# Patient Record
Sex: Male | Born: 1995 | Race: White | Hispanic: No | Marital: Single | Smoking: Never smoker
Health system: Southern US, Community
[De-identification: ages and names within clinical notes are randomized; demographics above are authoritative.]

---

## 2016-10-06 ENCOUNTER — Emergency Department (HOSPITAL_COMMUNITY): Admission: EM | Admit: 2016-10-06 | Discharge: 2016-10-06 | Discharge status: Against Medical Advice | Payer: Self-pay

## 2016-10-07 DIAGNOSIS — Z79899 Other long term (current) drug therapy: Secondary | ICD-10-CM | POA: Diagnosis not present

## 2016-10-07 DIAGNOSIS — G43809 Other migraine, not intractable, without status migrainosus: Secondary | ICD-10-CM | POA: Insufficient documentation

## 2016-10-07 DIAGNOSIS — H5712 Ocular pain, left eye: Secondary | ICD-10-CM | POA: Diagnosis present

## 2016-10-07 NOTE — ED Triage Notes (Signed)
Pt reports eye pain x 7 days.  Pt reports that he didn't injure his eye in any way.  Pt reports the pain feels like pressure behind his eye.  Pt reports that the corners of his vision are hazy.  Pt a/o x 4 and ambulatory. No obvious trauma noted to eye.

## 2016-10-08 ENCOUNTER — Other Ambulatory Visit: Payer: Self-pay | Admitting: Ophthalmology

## 2016-10-08 ENCOUNTER — Encounter (HOSPITAL_COMMUNITY): Payer: Self-pay | Admitting: *Deleted

## 2016-10-08 ENCOUNTER — Emergency Department (HOSPITAL_COMMUNITY)
Admission: EM | Admit: 2016-10-08 | Discharge: 2016-10-08 | Disposition: A | Discharge status: Home / Self Care | Payer: BLUE CROSS/BLUE SHIELD | Attending: Emergency Medicine | Admitting: Emergency Medicine

## 2016-10-08 DIAGNOSIS — G43109 Migraine with aura, not intractable, without status migrainosus: Secondary | ICD-10-CM

## 2016-10-08 DIAGNOSIS — G4459 Other complicated headache syndrome: Secondary | ICD-10-CM

## 2016-10-08 DIAGNOSIS — H5702 Anisocoria: Secondary | ICD-10-CM

## 2016-10-08 MED ORDER — SODIUM CHLORIDE 0.9 % IV BOLUS (SEPSIS)
1000.0000 mL | Freq: Once | INTRAVENOUS | Status: AC
  Administered 2016-10-08: 1000 mL via INTRAVENOUS

## 2016-10-08 MED ORDER — TETRACAINE HCL 0.5 % OP SOLN
1.0000 [drp] | Freq: Once | OPHTHALMIC | Status: AC
  Administered 2016-10-08: 1 [drp] via OPHTHALMIC
  Filled 2016-10-08: qty 4

## 2016-10-08 MED ORDER — KETOROLAC TROMETHAMINE 30 MG/ML IJ SOLN
30.0000 mg | Freq: Once | INTRAMUSCULAR | Status: AC
  Administered 2016-10-08: 30 mg via INTRAVENOUS
  Filled 2016-10-08: qty 1

## 2016-10-08 MED ORDER — BUTALBITAL-APAP-CAFFEINE 50-325-40 MG PO TABS: 1.0000 | ORAL_TABLET | Freq: Three times a day (TID) | ORAL | 0 refills | Status: AC | PRN

## 2016-10-08 MED ORDER — DEXAMETHASONE SODIUM PHOSPHATE 10 MG/ML IJ SOLN
10.0000 mg | Freq: Once | INTRAMUSCULAR | Status: AC
  Administered 2016-10-08: 10 mg via INTRAVENOUS
  Filled 2016-10-08: qty 1

## 2016-10-08 MED ORDER — FLUORESCEIN SODIUM 0.6 MG OP STRP
1.0000 | ORAL_STRIP | Freq: Once | OPHTHALMIC | Status: AC
  Administered 2016-10-08: 1 via OPHTHALMIC
  Filled 2016-10-08: qty 1

## 2016-10-08 NOTE — ED Provider Notes (Signed)
[facility]-EMERGENCY DEPT Provider Note   CSN: [id number] Arrival date & time: 10/07/16  2352  By signing my name below, I, Lyndon CodeMaurice Copeland Jr., attest that this documentation has been prepared under the direction and in the presence of TRW AutomotiveKelly Quantel Mcinturff, PA-C. Electronically Signed: Lyndon CodeMaurice Copeland Jr., ED Scribe. 10/08/16. 2:33 AM.   History   Chief Complaint Chief Complaint  Patient presents with  . Eye Pain    left    HPI  Matthew Zavala is a 21 y.o. male who presents to the Emergency Department complaining of constant, worsening mild to moderate OS eye pain with sudden onset x2 hours. Pt states that for the past x7 days he has had intermittent OS eye pain. He reportedly came to the ED yesterday for this problem but left before he could be seen. Today, pt presents with the OS eye pain that he states is worse and localized to the top portion of the eye. He describes the pain as a throbbing pressure and states that his vision is fuzzy at the top and bottom of the OS visual field. Pt reports photophobia. He has tried tylenol with no relief. He denies trauma to the eye. Of note, pt wears glasses as needed and denies wearing contacts.   The history is provided by the patient. No language interpreter was used.    History reviewed. No pertinent past medical history.  There are no active problems to display for this patient.   History reviewed. No pertinent surgical history.     Home Medications    Prior to Admission medications   Medication Sig Start Date End Date Taking? Authorizing Provider  butalbital-acetaminophen-caffeine (FIORICET, ESGIC) [phone] MG tablet Take 1-2 tablets by mouth every 8 (eight) hours as needed for headache. 10/08/16 10/08/17  Antony MaduraKelly Anjenette Gerbino, PA-C    Family History No family history on file.  Social History Social History  Substance Use Topics  . Smoking status: Never Smoker  . Smokeless tobacco: Never Used  . Alcohol use Yes     Comment: occasional      Allergies   Patient has no known allergies.   Review of Systems Review of Systems  Constitutional: Negative for fever.  Eyes: Positive for pain (OS).  Ten systems reviewed and are negative for acute change, except as noted in the HPI.    Physical Exam Updated Vital Signs BP 121/83   Pulse 102   Temp 97.7 F (36.5 C) (Oral)   Resp 16   Ht 6' (1.829 m)   Wt 63.5 kg   SpO2 95%   BMI 18.99 kg/m   Physical Exam  Constitutional: He is oriented to person, place, and time. He appears well-developed and well-nourished. No distress.  Nontoxic and in NAD  HENT:  Head: Normocephalic and atraumatic.  Mouth/Throat: Oropharynx is clear and moist.  Eyes: EOM are normal. Pupils are equal, round, and reactive to light. No scleral icterus.  Pain with EOMs of the left eye. No consensual photophobia. IOP is 11 with 95% CI and 15 with 95% CI on second attempt in the left eye. No proptosis or hyphema. Mild conjunctival injection. No tearing or purulence. No uptake on fluorescein staining of the left eye. No dendritic staining. Negative Seidel's sign. Negative cell and flare on slit lamp exam.  Neck: Normal range of motion.  Cardiovascular: Normal rate, regular rhythm and intact distal pulses.   Pulmonary/Chest: Effort normal and breath sounds normal. No respiratory distress. He has no wheezes.  Respirations even and unlabored  Abdominal:  Soft. There is no tenderness.  Musculoskeletal: He exhibits no edema.  Neurological: He is alert and oriented to person, place, and time. He exhibits normal muscle tone. Coordination normal.  GCS 15. Patient moving all extremities.  Skin: Skin is warm and dry. No rash noted. He is not diaphoretic. No erythema. No pallor.  Psychiatric: He has a normal mood and affect.  Nursing note and vitals reviewed.    ED Treatments / Results   DIAGNOSTIC STUDIES: Oxygen Saturation is 95% on RA, inadequate by my interpretation.   COORDINATION OF CARE: 12:02  AM-Discussed next steps with pt. Pt verbalized understanding and is agreeable with the plan.   2:10 AM-Patient with improvement in symptoms; reports mild, slight throbbing pain that persists. Will examine with slit lamp. Patient agrees.  Labs (all labs ordered are listed, but only abnormal results are displayed) Labs Reviewed - No data to display  EKG  EKG Interpretation None       Radiology No results found.  Procedures Procedures (including critical care time)  Medications Ordered in ED Medications  fluorescein ophthalmic strip 1 strip (1 strip Both Eyes Given by Other 10/08/16 0029)  tetracaine (PONTOCAINE) 0.5 % ophthalmic solution 1 drop (1 drop Both Eyes Given by Other 10/08/16 0029)  ketorolac (TORADOL) 30 MG/ML injection 30 mg (30 mg Intravenous Given 10/08/16 0040)  dexamethasone (DECADRON) injection 10 mg (10 mg Intravenous Given 10/08/16 0040)  sodium chloride 0.9 % bolus 1,000 mL (1,000 mLs Intravenous New Bag/Given 10/08/16 0040)     Initial Impression / Assessment and Plan / ED Course  I have reviewed the triage vital signs and the nursing notes.  Pertinent labs & imaging results that were available during my care of the patient were reviewed by me and considered in my medical decision making (see chart for details).     21 year old male presents to the emergency department for evaluation of 7 days of waxing and waning, gradually worsening pressure behind his left thigh causing pain. Patient with no evidence of acute glaucoma. No evidence of corneal abrasion, ulcer, or dendritic staining. No findings concerning for globe rupture. No consensual photophobia and negative cell and flare; doubt iritis/uveitis. Patient has had no acute vision loss. I do not have underlying concern for retinal detachment, optic neuritis, or arterial occlusion.   Symptoms have improved with Toradol and Decadron. Suspect ocular migraine. Will refer to ophthalmology for outpatient management.  Supportive therapy instructions provided. Will prescribe Fioricet for persistent headache. Return precautions discussed and provided. Patient discharged in stable condition with no unaddressed concerns.   Final Clinical Impressions(s) / ED Diagnoses   Final diagnoses:  Ocular migraine    New Prescriptions New Prescriptions   BUTALBITAL-ACETAMINOPHEN-CAFFEINE (FIORICET, ESGIC) 50-325-40 MG TABLET    Take 1-2 tablets by mouth every 8 (eight) hours as needed for headache.    I personally performed the services described in this documentation, which was scribed in my presence. The recorded information has been reviewed and is accurate.       Antony Madura, PA-C 10/08/16 1610    Gilda Crease, MD 10/09/16 [phone]

## 2016-10-10 ENCOUNTER — Emergency Department (HOSPITAL_COMMUNITY): Payer: BLUE CROSS/BLUE SHIELD

## 2016-10-10 ENCOUNTER — Encounter (HOSPITAL_COMMUNITY): Payer: Self-pay

## 2016-10-10 ENCOUNTER — Emergency Department (HOSPITAL_COMMUNITY)
Admission: EM | Admit: 2016-10-10 | Discharge: 2016-10-10 | Disposition: A | Discharge status: Home / Self Care | Payer: BLUE CROSS/BLUE SHIELD | Attending: Emergency Medicine | Admitting: Emergency Medicine

## 2016-10-10 DIAGNOSIS — R51 Headache: Secondary | ICD-10-CM | POA: Diagnosis present

## 2016-10-10 DIAGNOSIS — G43809 Other migraine, not intractable, without status migrainosus: Secondary | ICD-10-CM

## 2016-10-10 MED ORDER — DIPHENHYDRAMINE HCL 50 MG/ML IJ SOLN
25.0000 mg | Freq: Once | INTRAMUSCULAR | Status: AC
  Administered 2016-10-10: 25 mg via INTRAVENOUS
  Filled 2016-10-10: qty 1

## 2016-10-10 MED ORDER — PROCHLORPERAZINE EDISYLATE 5 MG/ML IJ SOLN
10.0000 mg | Freq: Once | INTRAMUSCULAR | Status: AC
  Administered 2016-10-10: 10 mg via INTRAVENOUS
  Filled 2016-10-10: qty 2

## 2016-10-10 MED ORDER — KETOROLAC TROMETHAMINE 30 MG/ML IJ SOLN
30.0000 mg | Freq: Once | INTRAMUSCULAR | Status: AC
  Administered 2016-10-10: 30 mg via INTRAVENOUS
  Filled 2016-10-10: qty 1

## 2016-10-10 MED ORDER — SODIUM CHLORIDE 0.9 % IV BOLUS (SEPSIS)
1000.0000 mL | Freq: Once | INTRAVENOUS | Status: AC
  Administered 2016-10-10: 1000 mL via INTRAVENOUS

## 2016-10-10 MED ORDER — PROCHLORPERAZINE MALEATE 10 MG PO TABS: 10.0000 mg | ORAL_TABLET | Freq: Two times a day (BID) | ORAL | 0 refills | Status: AC | PRN

## 2016-10-10 NOTE — ED Triage Notes (Signed)
Pt reports ongoing migraine for more than one week. He has been seen for same but states the headaches have gotten worse even with taking prescribed meds. Pt also reports blurred vision in his left eye.

## 2016-10-10 NOTE — ED Provider Notes (Signed)
[facility]-EMERGENCY DEPT Provider Note   CSN: [id number] Arrival date & time: 10/10/16  1425  By signing my name below, I, Matthew Zavala, attest that this documentation has been prepared under the direction and in the presence of Matthew Zavala Janthony Holleman, MD. Electronically Signed: Rosario AdieWilliam Andrew Zavala, ED Scribe. 10/10/16. 4:55 PM.  History   Chief Complaint Chief Complaint  Patient presents with  . Migraine   The history is provided by the patient. No language interpreter was used.   HPI Comments: Matthew Zavala is a 21 y.o. male with a h/o migraines, who presents to the Emergency Department complaining of persistent, waxing and waning left-sided headache localized behind the left eye beginning four days ago, worsening significantly since last night. He notes associated nausea, left eye blurriness, phonophobia, and photophobia. Pt was seen in the ED three days ago for his migraine, where he was given a migraine cocktail and a prescription for Fioricet. He notes that his migraine had improved from these treatments originally, but he took Fiorcet today without improvement of his worsening headache. He was seen by an Opthalmologist two days ago following being seen in the ED with a benign visual workup. His Opthalmologist subsequently ordered him to have a CT Head performed on 10/12/16. Pt has otherwise not been seen by a Neurologist for his migraines. Pt has a h/o frontal migraines every 1-2 months, and notes that his headache today is a little different and much more severe. No FHx of familial migraines. Pt reports that he is in college, but denies any recent overt stress to precipitate his current migraine. He has recently been sick with B32 virus prior to the onset of his headache. Pt has otherwise had normal PO intake. He denies fever, vomiting, unilateral numbness/weakness, or any other associated symptoms.   History reviewed. No pertinent past medical history.  There are no active problems to display  for this patient.  History reviewed. No pertinent surgical history.  Home Medications    Prior to Admission medications   Medication Sig Start Date End Date Taking? Authorizing Provider  butalbital-acetaminophen-caffeine (FIORICET, ESGIC) [phone] MG tablet Take 1-2 tablets by mouth every 8 (eight) hours as needed for headache. 10/08/16 10/08/17  Antony MaduraKelly Humes, PA-C  prochlorperazine (COMPAZINE) 10 MG tablet Take 1 tablet (10 mg total) by mouth 2 (two) times daily as needed for nausea or vomiting (migraine headache). 10/10/16   Matthew Zavala Lindyn Vossler, MD   Family History History reviewed. No pertinent family history.  Social History Social History  Substance Use Topics  . Smoking status: Never Smoker  . Smokeless tobacco: Never Used  . Alcohol use Yes     Comment: occasional   Allergies   Patient has no known allergies.  Review of Systems Review of Systems 10/14 systems reviewed and are negative other than those stated in the HPI  Physical Exam Updated Vital Signs BP 97/60 (BP Location: Left Arm)   Pulse 84   Temp 98.4 F (36.9 C) (Oral)   Resp 18   SpO2 97%   Physical Exam Physical Exam  Nursing note and vitals reviewed. Constitutional: Well developed, well nourished, non-toxic, and in no acute distress Head: Normocephalic and atraumatic.  Mouth/Throat: Oropharynx is clear and moist.  Neck: Normal range of motion. Neck supple. No meningismus.   Cardiovascular: Normal rate and regular rhythm.   Pulmonary/Chest: Effort normal and breath sounds normal.  Abdominal: Soft. There is no tenderness. There is no rebound and no guarding.  Musculoskeletal: Normal range of motion.  Neurological: Alert, no facial droop, fluent speech, moves all extremities symmetrically Skin: Skin is warm and dry.  Psychiatric: Cooperative  Neurological:  Alert, oriented to person, place, time, and situation. Memory grossly in tact. Fluent speech. No dysarthria or aphasia.  Cranial nerves: VF are full.  Pupils are symmetric, and reactive to light. EOMI without nystagmus. No gaze deviation. Facial muscles symmetric with activation. Sensation to light touch over face in tact bilaterally. Hearing grossly in tact. Palate elevates symmetrically. Head turn and shoulder shrug are intact. Tongue midline.  Reflexes defered.  Muscle bulk and tone normal. No pronator drift. Moves all extremities symmetrically. Sensation to light touch is in tact throughout in bilateral upper and lower extremities. Coordination reveals no dysmetria with finger to nose.  ED Treatments / Results  DIAGNOSTIC STUDIES: Oxygen Saturation is 98% on RA, normal by my interpretation.   COORDINATION OF CARE: 4:54 PM-Discussed next steps with pt. Pt verbalized understanding and is agreeable with the plan.   Labs (all labs ordered are listed, but only abnormal results are displayed) Labs Reviewed - No data to display  EKG  EKG Interpretation None      Radiology Ct Head Wo Contrast  Result Date: 10/10/2016 CLINICAL DATA:  Acute onset of migraine headache. Pain behind the left eye. Blurred vision. Initial encounter. EXAM: CT HEAD WITHOUT CONTRAST TECHNIQUE: Contiguous axial images were obtained from the base of the skull through the vertex without intravenous contrast. COMPARISON:  None. FINDINGS: Brain: No evidence of acute infarction, hemorrhage, hydrocephalus, extra-axial collection or mass lesion/mass effect. The posterior fossa, including the cerebellum, brainstem and fourth ventricle, is within normal limits. The third and lateral ventricles, and basal ganglia are unremarkable in appearance. The cerebral hemispheres are symmetric in appearance, with normal gray-white differentiation. No mass effect or midline shift is seen. Vascular: No hyperdense vessel or unexpected calcification. Skull: There is no evidence of fracture; visualized osseous structures are unremarkable in appearance. Sinuses/Orbits: The orbits are within  normal limits. There is partial opacification of the left maxillary sinus, left side of the sphenoid sinus, bilateral ethmoid air cells and left frontal sinus. The remaining paranasal sinuses and mastoid air cells are well-aerated. Other: No significant soft tissue abnormalities are seen. IMPRESSION: 1. No acute intracranial pathology seen on CT. 2. Partial opacification of the left maxillary sinus, left side of the sphenoid sinus, bilateral ethmoid air cells and left frontal sinus. Electronically Signed   By: Roanna Raider M.D.   On: 10/10/2016 17:49    Procedures Procedures   Medications Ordered in ED Medications  sodium chloride 0.9 % bolus 1,000 mL (0 mLs Intravenous Stopped 10/10/16 1900)  ketorolac (TORADOL) 30 MG/ML injection 30 mg (30 mg Intravenous Given 10/10/16 1711)  prochlorperazine (COMPAZINE) injection 10 mg (10 mg Intravenous Given 10/10/16 1708)  diphenhydrAMINE (BENADRYL) injection 25 mg (25 mg Intravenous Given 10/10/16 1710)    Initial Impression / Assessment and Plan / ED Course  I have reviewed the triage vital signs and the nursing notes.  Pertinent labs & imaging results that were available during my care of the patient were reviewed by me and considered in my medical decision making (see chart for details).     21 year old male with history of headaches who presents with atypical migraine headache. He is nontoxic in no acute distress with normal vital signs. He has a normal neurological exam. Just had ophthalmology exam earlier this week that was unremarkable. He was ordered outpatient CT head, which is performed here. This  is visualized and shows no acute intracranial processes. He received migraine cocktail, with improvement in his symptoms. Felt comfortable managing symptoms from home. Referral provided for neurology as needed. Strict return and follow-up instructions reviewed. He expressed understanding of all discharge instructions and felt comfortable with the plan of  care.   Final Clinical Impressions(s) / ED Diagnoses   Final diagnoses:  Other migraine without status migrainosus, not intractable   New Prescriptions New Prescriptions   PROCHLORPERAZINE (COMPAZINE) 10 MG TABLET    Take 1 tablet (10 mg total) by mouth 2 (two) times daily as needed for nausea or vomiting (migraine headache).   I personally performed the services described in this documentation, which was scribed in my presence. The recorded information has been reviewed and is accurate.     Matthew Guise, MD 10/10/16 Barry Brunner

## 2016-10-10 NOTE — ED Notes (Signed)
Patient transported to CT 

## 2016-10-10 NOTE — Discharge Instructions (Signed)
Your CT is reassuring.  Please continue fioricet and/or ibuprofen as needed for pain. You can use compazine as well as prescribed. Avoid taking ibuprofen and Fioricet around-the-clock for more than 3 or 4 days in a row as it can cause bad rebound headaches when you stop.  You have been given neurology referral for further management of your headaches.   Return without fail for worsening symptoms, including confusion, fevers, worsening pain, new vision or speech changes, new numbness or weakness, intractable vomiting, or any other symptoms concerning to you

## 2016-10-12 ENCOUNTER — Other Ambulatory Visit: Payer: BLUE CROSS/BLUE SHIELD

## 2017-10-23 ENCOUNTER — Emergency Department (HOSPITAL_COMMUNITY)
Admission: EM | Admit: 2017-10-23 | Discharge: 2017-10-23 | Disposition: A | Discharge status: Home / Self Care | Payer: Worker's Compensation | Attending: Emergency Medicine | Admitting: Emergency Medicine

## 2017-10-23 ENCOUNTER — Other Ambulatory Visit: Payer: Self-pay

## 2017-10-23 ENCOUNTER — Encounter (HOSPITAL_COMMUNITY): Payer: Self-pay | Admitting: Emergency Medicine

## 2017-10-23 ENCOUNTER — Emergency Department (HOSPITAL_COMMUNITY): Payer: Worker's Compensation

## 2017-10-23 DIAGNOSIS — W260XXA Contact with knife, initial encounter: Secondary | ICD-10-CM | POA: Diagnosis not present

## 2017-10-23 DIAGNOSIS — Y999 Unspecified external cause status: Secondary | ICD-10-CM | POA: Insufficient documentation

## 2017-10-23 DIAGNOSIS — S61212A Laceration without foreign body of right middle finger without damage to nail, initial encounter: Secondary | ICD-10-CM | POA: Insufficient documentation

## 2017-10-23 DIAGNOSIS — IMO0001 Reserved for inherently not codable concepts without codable children: Secondary | ICD-10-CM

## 2017-10-23 DIAGNOSIS — Y929 Unspecified place or not applicable: Secondary | ICD-10-CM | POA: Insufficient documentation

## 2017-10-23 DIAGNOSIS — Y9389 Activity, other specified: Secondary | ICD-10-CM | POA: Diagnosis not present

## 2017-10-23 DIAGNOSIS — S6991XA Unspecified injury of right wrist, hand and finger(s), initial encounter: Secondary | ICD-10-CM | POA: Diagnosis present

## 2017-10-23 MED ORDER — LIDOCAINE HCL 2 % IJ SOLN
20.0000 mL | Freq: Once | INTRAMUSCULAR | Status: AC
  Administered 2017-10-23: 400 mg via INTRADERMAL
  Filled 2017-10-23: qty 20

## 2017-10-23 NOTE — ED Notes (Signed)
ED Provider at bedside. 

## 2017-10-23 NOTE — ED Provider Notes (Signed)
[facility]   CSN: [id number] Arrival date & time: 10/23/17  2027     History   Chief Complaint Chief Complaint  Patient presents with  . Finger Injury    HPI Matthew GuessJared Zavala is a 22 y.o. right handed male who presents with a right middle finger laceration.  No significant past medical history.  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  It is over the dorsal and distal aspect of his middle finger.  He has full range of motion of his finger.  He is up-to-date on tetanus.  He denies any numbness, tingling, weakness of the finger.  HPI  History reviewed. No pertinent past medical history.  There are no active problems to display for this patient.   History reviewed. No pertinent surgical history.     Home Medications    Prior to Admission medications   Medication Sig Start Date End Date Taking? Authorizing Provider  prochlorperazine (COMPAZINE) 10 MG tablet Take 1 tablet (10 mg total) by mouth 2 (two) times daily as needed for nausea or vomiting (migraine headache). 10/10/16   Lavera GuiseLiu, Dana Duo, MD    Family History No family history on file.  Social History Social History   Tobacco Use  . Smoking status: Never Smoker  . Smokeless tobacco: Never Used  Substance Use Topics  . Alcohol use: Yes    Comment: occasional  . Drug use: No     Allergies   Patient has no known allergies.   Review of Systems Review of Systems  Skin: Positive for wound.  Neurological: Negative for weakness and numbness.    Physical Exam Updated Vital Signs BP 133/68 (BP Location: Left Arm)   Pulse 76   Temp 98.8 F (37.1 C) (Oral)   Resp 14   SpO2 98%   Physical Exam  Constitutional: He is oriented to person, place, and time. He appears well-developed and well-nourished. No distress.  HENT:  Head: Normocephalic and atraumatic.  Eyes: Conjunctivae are normal. Pupils are  equal, round, and reactive to light. Right eye exhibits no discharge. Left eye exhibits no discharge. No scleral icterus.  Neck: Normal range of motion.  Cardiovascular: Normal rate.  Pulmonary/Chest: Effort normal. No respiratory distress.  Abdominal: He exhibits no distension.  Musculoskeletal:  V-shaped laceration over the distal and dorsal aspect of the middle finger.  Full range of motion of the middle finger at the DIP and PIP joints.  Bleeding is controlled.  Neurological: He is alert and oriented to person, place, and time.  Skin: Skin is warm and dry.  Psychiatric: He has a normal mood and affect. His behavior is normal.  Nursing note and vitals reviewed.    ED Treatments / Results  Labs (all labs ordered are listed, but only abnormal results are displayed) Labs Reviewed - No data to display  EKG  EKG Interpretation None       Radiology Dg Finger Middle Right  Result Date: 10/23/2017 CLINICAL DATA:  Cut middle finger EXAM: RIGHT MIDDLE FINGER 2+V COMPARISON:  None. FINDINGS: There is no evidence of fracture or dislocation. There is no evidence of arthropathy or other focal bone abnormality. No radiopaque foreign body in the soft tissues. Laceration at the PIP joint IMPRESSION: No acute osseous abnormality Electronically Signed   By: Jasmine PangKim  Fujinaga M.D.   On: 10/23/2017 21:23    Procedures .Marland Kitchen.Laceration Repair Date/Time: 10/23/2017 11:20 PM Performed by:  Bethel Born, PA-C Authorized by: Bethel Born, PA-C   Consent:    Consent obtained:  Verbal   Consent given by:  Patient   Risks discussed:  Pain and infection   Alternatives discussed:  No treatment Anesthesia (see MAR for exact dosages):    Anesthesia method:  Local infiltration   Local anesthetic:  Lidocaine 2% w/o epi Laceration details:    Location:  Finger   Finger location:  R long finger   Length (cm):  2   Depth (mm):  2 Pre-procedure details:    Preparation:  Patient was prepped and  draped in usual sterile fashion and imaging obtained to evaluate for foreign bodies Exploration:    Hemostasis achieved with:  Direct pressure   Wound exploration: wound explored through full range of motion and entire depth of wound probed and visualized     Wound extent: no muscle damage noted and no tendon damage noted     Contaminated: no   Treatment:    Area cleansed with:  Saline   Amount of cleaning:  Standard   Irrigation solution:  Sterile saline   Irrigation volume:  20cc   Irrigation method:  Pressure wash   Visualized foreign bodies/material removed: no   Skin repair:    Repair method:  Sutures   Suture size:  5-0   Suture material:  Prolene   Suture technique:  Simple interrupted   Number of sutures:  3 Approximation:    Approximation:  Close   Vermilion border: well-aligned   Post-procedure details:    Dressing:  Sterile dressing   Patient tolerance of procedure:  Tolerated well, no immediate complications   (including critical care time)    Medications Ordered in ED Medications  lidocaine (XYLOCAINE) 2 % (with pres) injection 400 mg (400 mg Intradermal Given 10/23/17 2232)     Initial Impression / Assessment and Plan / ED Course  I have reviewed the triage vital signs and the nursing notes.  Pertinent labs & imaging results that were available during my care of the patient were reviewed by me and considered in my medical decision making (see chart for details).  22 year old with right middle finger laceration. It was repaired and irrigated in the ED. Bottom of the wound visualized and bleeding controlled. 3 sutures placed. Wound care discussed and advised to return to have stitches removed in 7 days. Tetanus is up to date. Return precautions discussed.    Final Clinical Impressions(s) / ED Diagnoses   Final diagnoses:  Laceration of right middle finger without foreign body without damage to nail, initial encounter    ED Discharge Orders    None        Bethel Born, PA-C 10/23/17 2322    Pricilla Loveless, MD 10/24/17 [phone]

## 2017-10-23 NOTE — ED Triage Notes (Signed)
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  Bleeding controlled at this time.

## 2017-10-23 NOTE — ED Notes (Signed)
Pt departed in NAD, refused use of wheelchair.  

## 2017-10-23 NOTE — Discharge Instructions (Signed)
Keep area clean and dry. You can wash with soap and water °Change bandage at least once daily, more if it is dirty °Watch for signs of infection (redness, drainage) °Have stitches removed in 7-10 days ° °

## 2017-11-05 IMAGING — CT CT HEAD W/O CM
4 series · 16 of 47 positions shown, 18 images · non-contrast
Comparison: None.

CLINICAL DATA: Acute onset of migraine headache. Pain behind the
left eye. Blurred vision. Initial encounter.

EXAM:
CT HEAD WITHOUT CONTRAST
TECHNIQUE: Contiguous axial images were obtained from the base of the skull
through the vertex without intravenous contrast.

[Series 2: head without · axial · non-contrast · 0.42mm/px · z∈[-183,-63]mm · 7 of 34 slices shown, 9 images]
[im 5/34  brain]
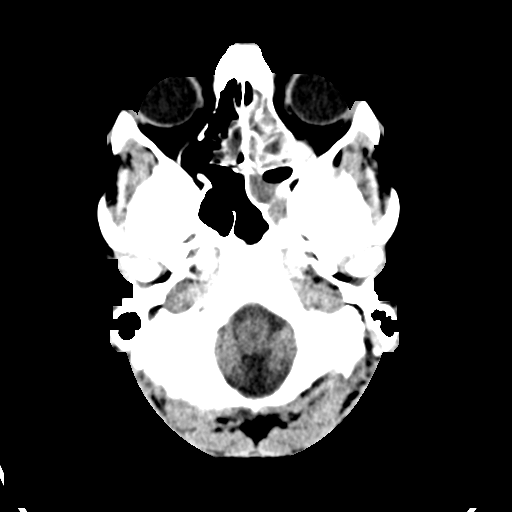
[im 5/34  bone]
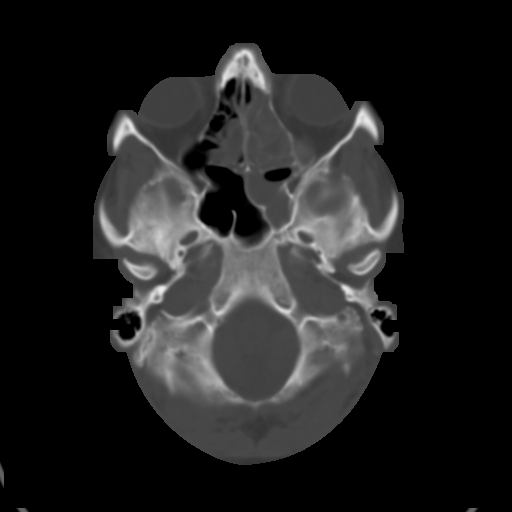
[im 9/34  brain]
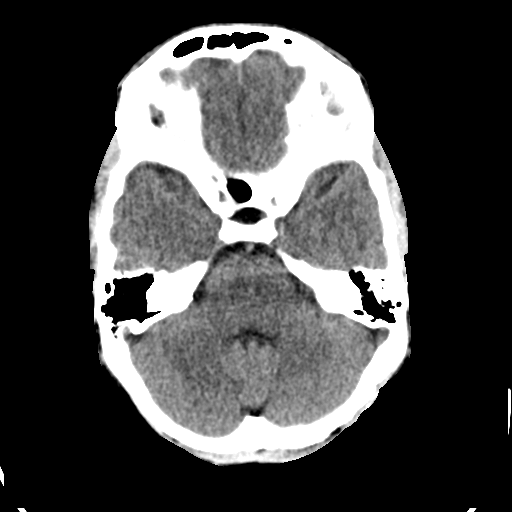
[im 13/34  brain]
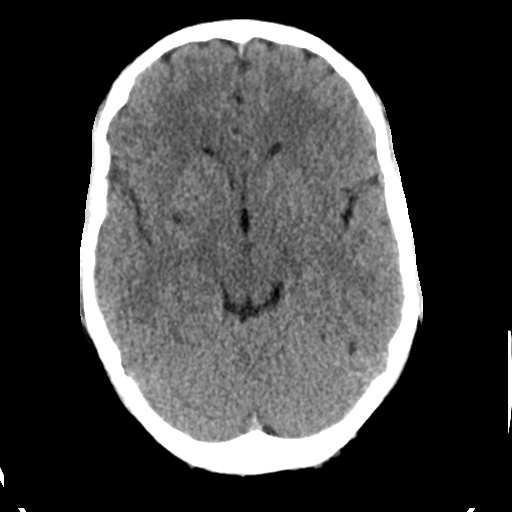
[im 17/34  brain]
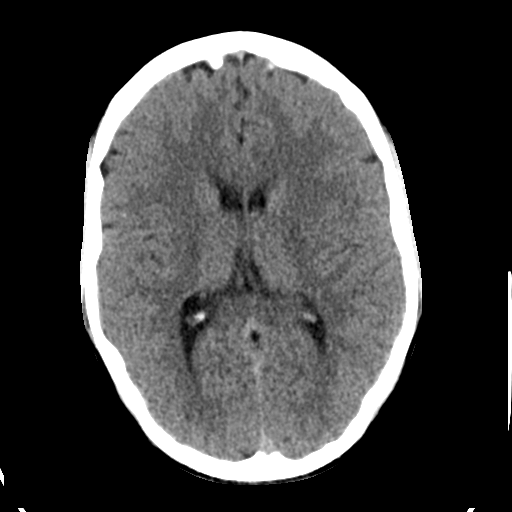
[im 21/34  brain]
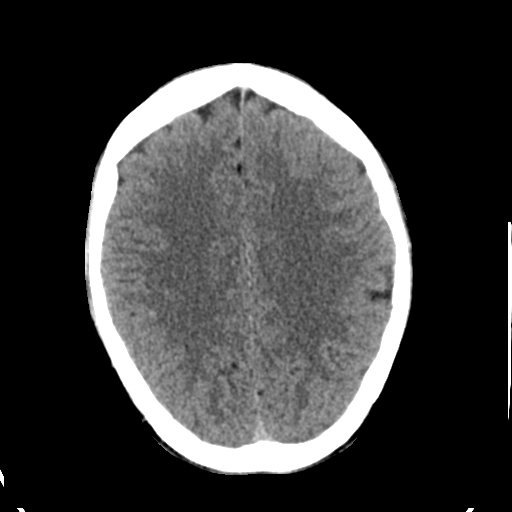
[im 21/34  bone]
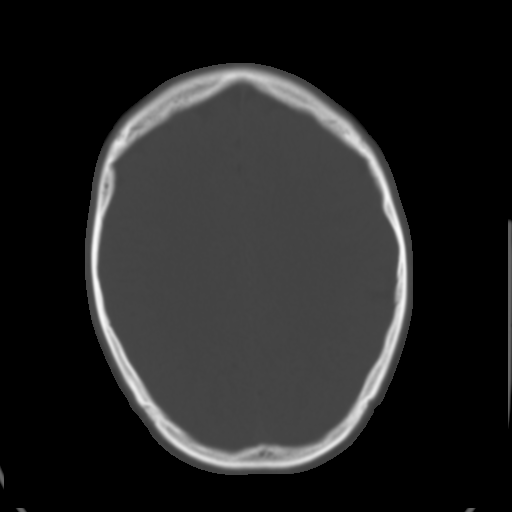
[im 25/34  brain]
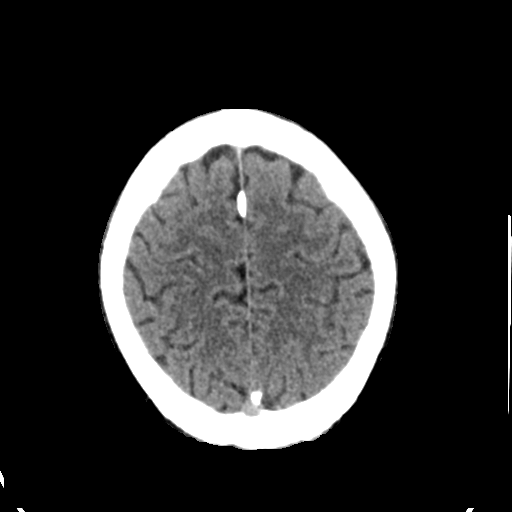
[im 29/34  brain]
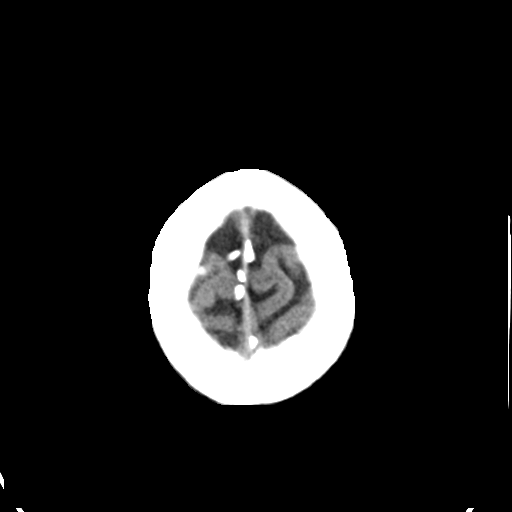

[Series 3: head bone · axial · 0.42mm/px · z∈[-187,-155]mm · 3 of 83 slices shown]
[im 9/83  bone]
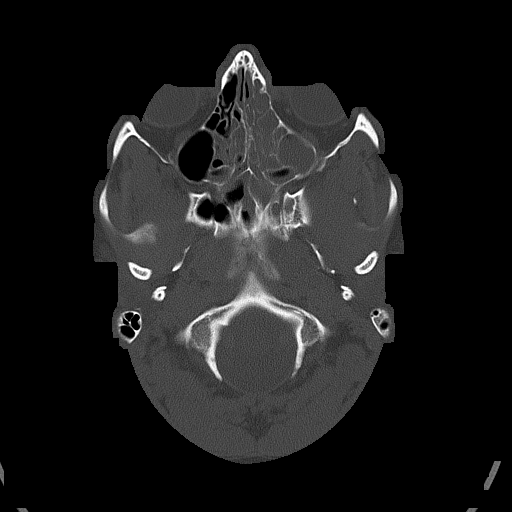
[im 17/83  bone]
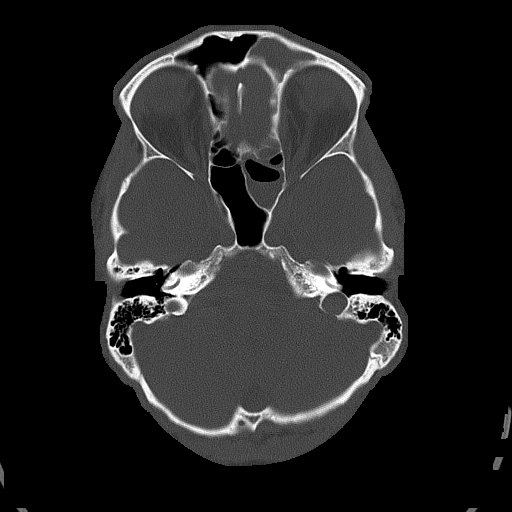
[im 25/83  bone]
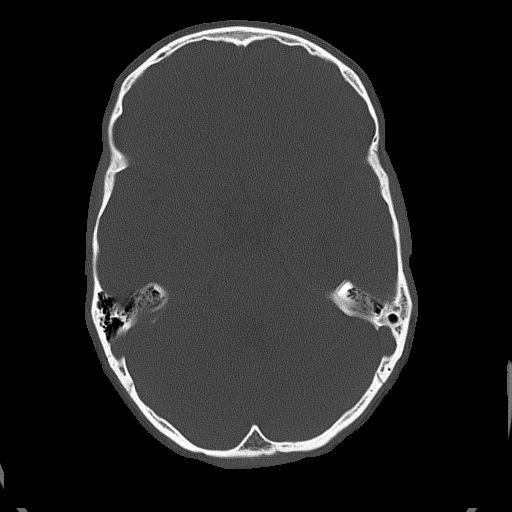

[Series 4: head without cor · coronal · non-contrast · 0.32mm/px · 3 of 72 slices shown]
[im 24/72  brain]
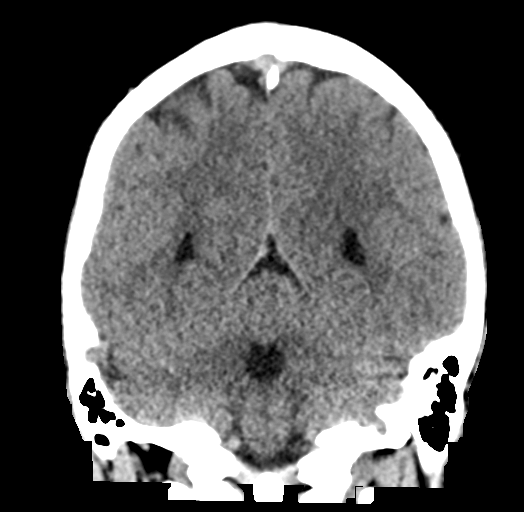
[im 32/72  brain]
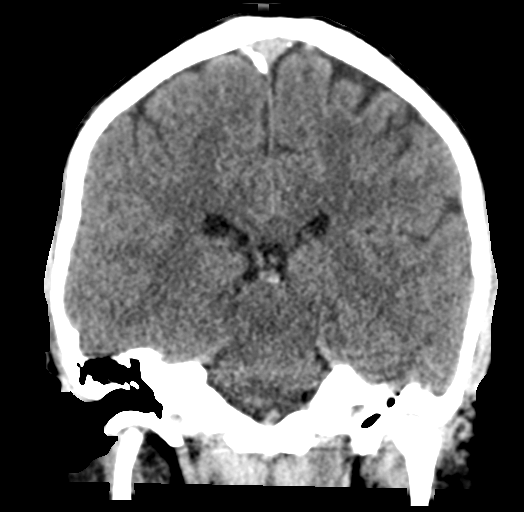
[im 40/72  brain]
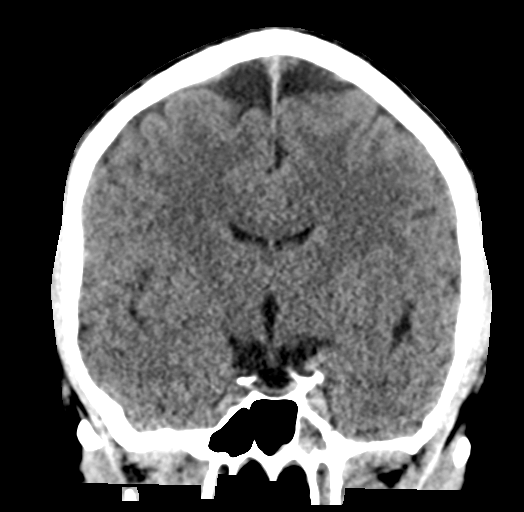

[Series 5: head without sag · sagittal · non-contrast · 0.32mm/px · 3 of 55 slices shown]
[im 19/55  brain]
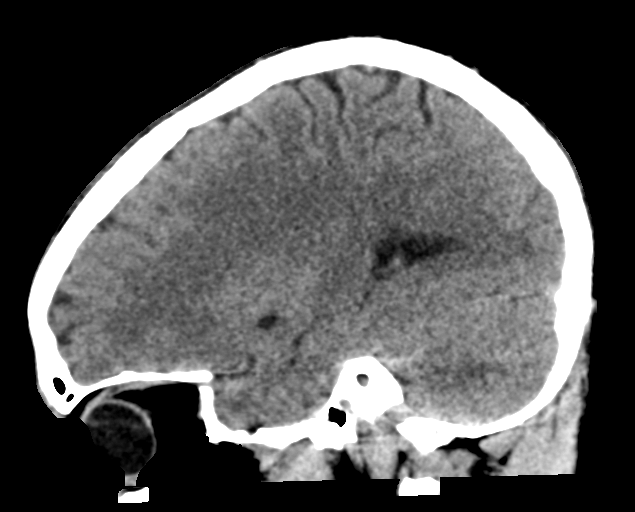
[im 28/55  brain]
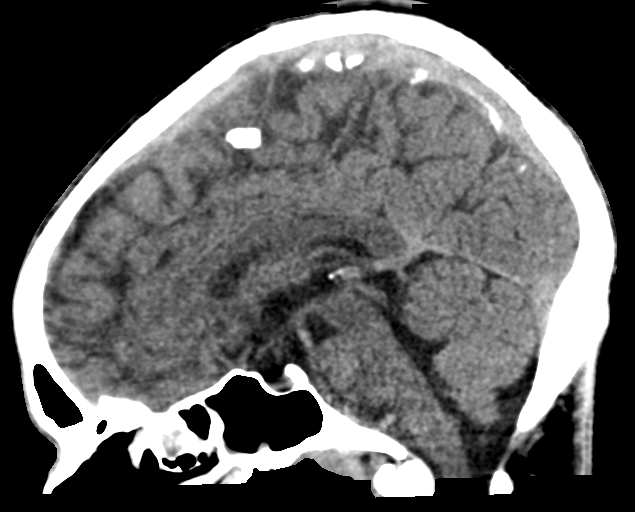
[im 37/55  brain]
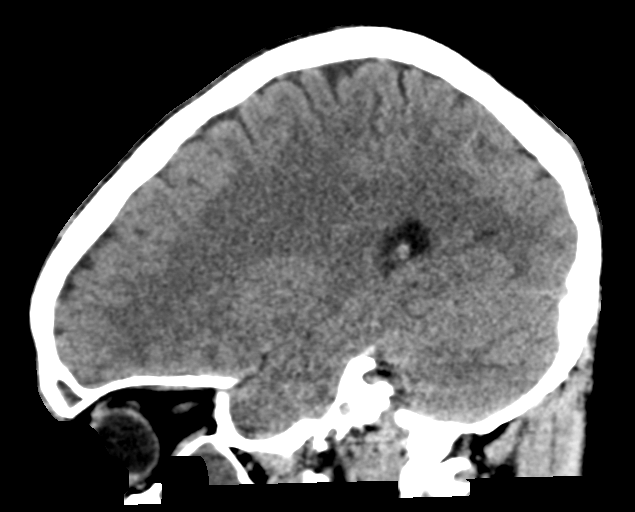

[16 of 47 positions shown; findings below may reference images not displayed]

FINDINGS: Brain: No evidence of acute infarction, hemorrhage, hydrocephalus,
extra-axial collection or mass lesion/mass effect.

The posterior fossa, including the cerebellum, brainstem and fourth
ventricle, is within normal limits. The third and lateral
ventricles, and basal ganglia are unremarkable in appearance. The
cerebral hemispheres are symmetric in appearance, with normal
gray-white differentiation. No mass effect or midline shift is seen.

Vascular: No hyperdense vessel or unexpected calcification.

Skull: There is no evidence of fracture; visualized osseous
structures are unremarkable in appearance.

Sinuses/Orbits: The orbits are within normal limits. There is
partial opacification of the left maxillary sinus, left side of the
sphenoid sinus, bilateral ethmoid air cells and left frontal sinus.
The remaining paranasal sinuses and mastoid air cells are
well-aerated.

Other: No significant soft tissue abnormalities are seen.
IMPRESSION: 1. No acute intracranial pathology seen on CT.
2. Partial opacification of the left maxillary sinus, left side of
the sphenoid sinus, bilateral ethmoid air cells and left frontal
sinus.

## 2018-04-29 ENCOUNTER — Encounter (HOSPITAL_COMMUNITY): Payer: Self-pay | Admitting: Emergency Medicine

## 2018-04-29 ENCOUNTER — Ambulatory Visit (HOSPITAL_COMMUNITY)
Admission: EM | Admit: 2018-04-29 | Discharge: 2018-04-29 | Disposition: A | Discharge status: Home / Self Care | Payer: BLUE CROSS/BLUE SHIELD | Attending: Family Medicine | Admitting: Family Medicine

## 2018-04-29 DIAGNOSIS — J020 Streptococcal pharyngitis: Secondary | ICD-10-CM

## 2018-04-29 LAB — POCT RAPID STREP A: Streptococcus, Group A Screen (Direct): POSITIVE — AB

## 2018-04-29 MED ORDER — LIDOCAINE VISCOUS HCL 2 % MT SOLN: 15.0000 mL | OROMUCOSAL | 0 refills | Status: AC | PRN

## 2018-04-29 MED ORDER — AMOXICILLIN 500 MG PO CAPS: 1000.0000 mg | ORAL_CAPSULE | Freq: Two times a day (BID) | ORAL | 0 refills | Status: AC

## 2018-04-29 NOTE — ED Provider Notes (Signed)
[facility]    CSN: [id number] Arrival date & time: 04/29/18  1036     History   Chief Complaint Chief Complaint  Patient presents with  . Sore Throat    appt 1100    HPI Matthew Zavala is a 22 y.o. male.   She is a healthy 22 year old male that presents with 3 days of sore throat, pain and swelling.  Symptoms have been constant and worsening.  He has had a slight cough.  Denies any congestion, fever, chills but is positive for body aches.  He does have a history of strep throat in the past.  He has been using salt water gargles, throat spray, hot tea for pain relief.   He does not smoke  ROS per HPI      History reviewed. No pertinent past medical history.  There are no active problems to display for this patient.   History reviewed. No pertinent surgical history.     Home Medications    Prior to Admission medications   Medication Sig Start Date End Date Taking? Authorizing Provider  amoxicillin (AMOXIL) 500 MG capsule Take 2 capsules (1,000 mg total) by mouth 2 (two) times daily. 04/29/18   Ryver Poblete, Gloris Manchester A, NP  lidocaine (XYLOCAINE) 2 % solution Use as directed 15 mLs in the mouth or throat as needed for mouth pain. 04/29/18   Dahlia Byes A, NP  prochlorperazine (COMPAZINE) 10 MG tablet Take 1 tablet (10 mg total) by mouth 2 (two) times daily as needed for nausea or vomiting (migraine headache). Patient not taking: Reported on 04/29/2018 10/10/16   Lavera Guise, MD    Family History History reviewed. No pertinent family history.  Social History Social History   Tobacco Use  . Smoking status: Never Smoker  . Smokeless tobacco: Never Used  Substance Use Topics  . Alcohol use: Yes    Comment: occasional  . Drug use: No     Allergies   Patient has no known allergies.   Review of Systems Review of Systems   Physical Exam Triage Vital Signs ED Triage Vitals [04/29/18 1107]  Enc Vitals Group     BP 106/68     Pulse Rate 67     Resp 18     Temp  98.4 F (36.9 C)     Temp Source Oral     SpO2 96 %     Weight      Height      Head Circumference      Peak Flow      Pain Score      Pain Loc      Pain Edu?      Excl. in GC?    No data found.  Updated Vital Signs BP 106/68 (BP Location: Right Arm)   Pulse 67   Temp 98.4 F (36.9 C) (Oral)   Resp 18   SpO2 96%   Visual Acuity Right Eye Distance:   Left Eye Distance:   Bilateral Distance:    Right Eye Near:   Left Eye Near:    Bilateral Near:     Physical Exam  Constitutional: He is oriented to person, place, and time. He appears well-developed and well-nourished.  Non-toxic appearance. He appears ill. No distress.  HENT:  Head: Normocephalic and atraumatic.  Right Ear: Hearing and tympanic membrane normal.  Left Ear: Hearing and tympanic membrane normal.  Mouth/Throat: Uvula is midline and mucous membranes are normal. Uvula swelling present. Posterior oropharyngeal erythema present.  Tonsils are 2+ on the right. Tonsils are 2+ on the left.  Bilateral TMs normal Posterior oropharyngeal erythema and 2+ tonsillar swelling. No obvious exudates.  Adenopathy present  Eyes: Pupils are equal, round, and reactive to light.  Cardiovascular: Normal rate and normal heart sounds.  Pulmonary/Chest: Effort normal and breath sounds normal. No respiratory distress.  Lungs clear in all fields.   Neurological: He is alert and oriented to person, place, and time.  Skin: Skin is warm and dry. Capillary refill takes less than 2 seconds.  Psychiatric: He has a normal mood and affect.  Nursing note and vitals reviewed.    UC Treatments / Results  Labs (all labs ordered are listed, but only abnormal results are displayed) Labs Reviewed  POCT RAPID STREP A - Abnormal; Notable for the following components:      Result Value   Streptococcus, Group A Screen (Direct) POSITIVE (*)    All other components within normal limits    EKG None  Radiology No results  found.  Procedures Procedures (including critical care time)  Medications Ordered in UC Medications - No data to display  Initial Impression / Assessment and Plan / UC Course  I have reviewed the triage vital signs and the nursing notes.  Pertinent labs & imaging results that were available during my care of the patient were reviewed by me and considered in my medical decision making (see chart for details).     Rapid strep positive. Will treat with amoxicillin Vicious lidocaine for pain Follow up as needed for continued or worsening symptoms  Final Clinical Impressions(s) / UC Diagnoses   Final diagnoses:  Strep pharyngitis     Discharge Instructions     It was nice meeting you!!  Your strep test was positive. We will go ahead and treat with antibiotics and I am giving you some lidocaine to help with the pain. You can also use tylenol for the pain. Follow up as needed for continued or worsening symptoms     ED Prescriptions    Medication Sig Dispense Auth. Provider   amoxicillin (AMOXIL) 500 MG capsule Take 2 capsules (1,000 mg total) by mouth 2 (two) times daily. 40 capsule Harman Langhans A, NP   lidocaine (XYLOCAINE) 2 % solution Use as directed 15 mLs in the mouth or throat as needed for mouth pain. 100 mL Dahlia Byes A, NP     Controlled Substance Prescriptions [place] Controlled Substance Registry consulted? Not Applicable   Janace Aris, NP 04/30/18 0930

## 2018-04-29 NOTE — Discharge Instructions (Addendum)
It was nice meeting you!!  Your strep test was positive. We will go ahead and treat with antibiotics and I am giving you some lidocaine to help with the pain. You can also use tylenol for the pain. Follow up as needed for continued or worsening symptoms

## 2018-04-29 NOTE — ED Triage Notes (Signed)
Pt here for sore throat x 3 days
